# Patient Record
Sex: Female | Born: 1990
Health system: Southern US, Community
[De-identification: ages and names within clinical notes are randomized; demographics above are authoritative.]

---

## 2017-05-02 DIAGNOSIS — G8929 Other chronic pain: Secondary | ICD-10-CM | POA: Diagnosis not present

## 2017-05-02 DIAGNOSIS — M79672 Pain in left foot: Secondary | ICD-10-CM | POA: Diagnosis not present

## 2017-06-09 DIAGNOSIS — R05 Cough: Secondary | ICD-10-CM | POA: Diagnosis not present

## 2017-06-09 DIAGNOSIS — J019 Acute sinusitis, unspecified: Secondary | ICD-10-CM | POA: Diagnosis not present

## 2017-06-25 DIAGNOSIS — Z6837 Body mass index (BMI) 37.0-37.9, adult: Secondary | ICD-10-CM | POA: Diagnosis not present

## 2017-06-25 DIAGNOSIS — J329 Chronic sinusitis, unspecified: Secondary | ICD-10-CM | POA: Diagnosis not present

## 2017-11-15 DIAGNOSIS — H6990 Unspecified Eustachian tube disorder, unspecified ear: Secondary | ICD-10-CM | POA: Diagnosis not present

## 2017-11-20 DIAGNOSIS — Z01419 Encounter for gynecological examination (general) (routine) without abnormal findings: Secondary | ICD-10-CM | POA: Diagnosis not present

## 2018-01-21 ENCOUNTER — Other Ambulatory Visit (HOSPITAL_COMMUNITY): Payer: Self-pay | Admitting: Orthopedic Surgery

## 2018-01-21 DIAGNOSIS — M545 Low back pain: Secondary | ICD-10-CM

## 2018-01-23 ENCOUNTER — Ambulatory Visit: Payer: PRIVATE HEALTH INSURANCE | Attending: Family Medicine | Admitting: Physical Therapy

## 2018-01-23 ENCOUNTER — Encounter: Payer: Self-pay | Admitting: Physical Therapy

## 2018-01-23 DIAGNOSIS — M5442 Lumbago with sciatica, left side: Secondary | ICD-10-CM | POA: Insufficient documentation

## 2018-01-23 DIAGNOSIS — M6281 Muscle weakness (generalized): Secondary | ICD-10-CM | POA: Insufficient documentation

## 2018-01-23 DIAGNOSIS — M546 Pain in thoracic spine: Secondary | ICD-10-CM | POA: Diagnosis present

## 2018-01-23 DIAGNOSIS — M5441 Lumbago with sciatica, right side: Secondary | ICD-10-CM | POA: Diagnosis present

## 2018-01-23 DIAGNOSIS — M62830 Muscle spasm of back: Secondary | ICD-10-CM | POA: Diagnosis present

## 2018-01-23 NOTE — Therapy (Signed)
[facility] [address] [place], [place], [address] Phone: [phone]   Fax:  [phone]  Physical Therapy Evaluation  Patient Details  Name: Karen Velez MRN: [id number] Date of Birth: 1990-08-02 Referring Provider: Dr Estill Bamberg   Encounter Date: 01/23/2018  PT End of Session - 01/23/18 1418    Visit Number  1    Number of Visits  6    Date for PT Re-Evaluation  02/20/18    Authorization Type  [facility], 6 visits approved, she is waiting until after her MRI results next week to return    PT Start Time  1418    PT Stop Time  1502    PT Time Calculation (min)  44 min    Activity Tolerance  Patient tolerated treatment well    Behavior During Therapy  [facility] for tasks assessed/performed       History reviewed. No pertinent past medical history.  History reviewed. No pertinent surgical history.  There were no vitals filed for this visit.   Subjective Assessment - 01/23/18 1421    Subjective  Pt has been referred to specialist and she no longer has the Lt achilles reflex and MD request to continue with eval however take it very slow until MRI is done.  Pt reports that her legs feel heavy and blat arms fatigue quickly.  Her pain started as pain in the center of her spine and has progressed down to the sacrum along with the tightness that started this week. 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     How long can you sit comfortably?  toleratation varies slouching feels better, constantly having to readjust due to pressure    How long can you walk comfortably?  walking is good little to no limitations    Diagnostic tests  xrays - were fine, MRI scheduled for Tuesday 9/5    Currently in Pain?  Yes    Pain Score  7     Pain Location  Back    Pain Orientation  Left;Lower;Mid    Pain Descriptors / Indicators  Tightness;Sore    Pain Type  Acute pain    Pain Onset  1 to 4 weeks ago    Pain Frequency  Constant    Aggravating Factors   sitting, movement    Pain Relieving Factors  nothing         [facility] PT Assessment - 01/23/18 0001      Assessment   Medical Diagnosis  Low back pain     Referring Provider  Dr Estill Bamberg    Onset Date/Surgical Date  12/19/17    Hand Dominance  Right    Next MD Visit  02/03/18    Prior Therapy  none      Precautions   Precautions  Other (comment)   taking it easy until MRI,    Precaution Comments  no lifitng over 15#      Balance Screen   Has the patient fallen in the past 6 months  No      Home Environment   Living Environment  Private residence    Home Layout  Two level   doing ok on stairs     Prior Function   Level of Independence  Independent    Vocation  Full time employment    Vocation Requirements  CNA lifting transfering paients    Leisure  hang out with friends      Observation/Other Assessments   Focus  on Therapeutic Outcomes (FOTO)   53% limited      Functional Tests   Functional tests  Squat;Single leg stance      Squat   Comments  WNL      Single Leg Stance   Comments  bilat  > 10 sec       Posture/Postural Control   Posture/Postural Control  Postural limitations    Postural Limitations  Forward head;Increased lumbar lordosis   extra abdominal girth   Posture Comments  Lt LE longer than Rt       ROM / Strength   AROM / PROM / Strength  AROM;Strength      AROM   AROM Assessment Site  Lumbar    Lumbar Flexion  4" from floor HS pulling    Lumbar Extension  75% present with pulling and pain at waist line    Lumbar - Right Side Bend  90% with low back pain    Lumbar - Left Side Bend  90% with low back pain    Lumbar - Right Rotation  WNL with slight pain    Lumbar - Left Rotation  WNL with slght pain      Strength   Strength Assessment Site  Hip;Knee;Ankle;Lumbar    Right/Left Hip  Left;Right   grossly 4+/5, took a sec for her to catch hole   Right/Left Knee  --   Lt WNL, Rt 4+/5   Right/Left Ankle  --   WNL    Lumbar Flexion  --   TA fair   Lumbar Extension  --   multifidi fair     Flexibility   Soft Tissue Assessment /Muscle Length  yes   hypermobile through her hips   Hamstrings  WNL -       Palpation   Spinal mobility  hypermobile in thoracic T3-7, pain with PA mob in upper sacrum, all others WNL with CPA mobs     Palpation comment  tight and tender in bilat lower thoracic and lumbar paraspinals and Rt gluts.       Special Tests   Other special tests  (-) slump test bilat                Objective measurements completed on examination: See above findings.      [facility] Adult PT Treatment/Exercise - 01/23/18 0001      Exercises   Exercises  Lumbar      Lumbar Exercises: Stretches   Lower Trunk Rotation  --   10 reps     Lumbar Exercises: Supine   Ab Set  10 reps;5 seconds   VC for form     Lumbar Exercises: Quadruped   Madcat/Old Horse  10 reps      Modalities   Modalities  Electrical Stimulation;Moist Heat      Moist Heat Therapy   Number Minutes Moist Heat  15 Minutes    Moist Heat Location  Lumbar Spine      Electrical Stimulation   Electrical Stimulation Location  lumbar    Electrical Stimulation Action  IFC    Electrical Stimulation Parameters  to tolerance    Electrical Stimulation Goals  Tone;Pain                  PT Long Term Goals - 01/23/18 1658      PT LONG TERM GOAL #1   Title  i with advanced HEP for hips and core     Time  6  Period  Weeks    Status  New    Target Date  03/06/18      PT LONG TERM GOAL #2   Title  perform lumbar ROM WNL with no pain to allow her to perform ADLs per her previous level     Time  6    Period  Weeks    Status  New    Target Date  03/06/18      PT LONG TERM GOAL #3   Title  improve bilat hip strength =/> 5-/5 to allow her to return to full duty at work    Time  6    Period  Weeks    Status  New    Target Date  03/06/18      PT LONG TERM GOAL #4   Title  demo safe ways to assist  patients to decrease risk of reinjury    Time  6    Period  Weeks    Status  New    Target Date  03/06/18      PT LONG TERM GOAL #5   Title  improve FOTO =/< 38% limited     Time  6    Period  Weeks    Status  New             Plan - 01/23/18 1436    Clinical Impression Statement  26 yo female presents with mid and low back pain, overall body fatigue and heaviness.  She has painful lumbar ROM, weakness in her hips and core and muscle spasms/tightness/pain in the paraspinals from mid thoracic to sacrum.  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  Pt reports her care has been transfered to Dr Yevette Edwards and he is concerned regarding absent Lt achilles reflex and has ordered a backMRI to assess. He asked for PT to be easy until results.  Pt had prior surgery to the Lt ankle however reports she never had decreased reflexes after that.     History and Personal Factors relevant to plan of care:  Lt ankle reconstruction  - slightly weak as compared to Rt     Clinical Presentation  Evolving    Clinical Decision Making  Low    Rehab Potential  Good    PT Frequency  2x / week    PT Duration  6 weeks    PT Treatment/Interventions  Dry needling;Manual techniques;Moist Heat;Traction;Therapeutic activities;Patient/family education;Taping;Ultrasound;Therapeutic exercise;Cryotherapy;Electrical Stimulation    PT Next Visit Plan  await results of MRI to see how to proceed. Has been approved 6 visits with [facility]    Consulted and Agree with Plan of Care  Patient       Patient will benefit from skilled therapeutic intervention in order to improve the following deficits and impairments:  Pain, Increased muscle spasms, Decreased strength, Decreased range of motion  Visit Diagnosis: Acute bilateral low back pain with bilateral sciatica - Plan: PT plan of care cert/re-cert  Pain in thoracic spine - Plan: PT plan of care cert/re-cert  Muscle weakness (generalized) - Plan: PT plan of care  cert/re-cert  Muscle spasm of back - Plan: PT plan of care cert/re-cert     Problem List There are no active problems to display for this patient.   Roderic Scarce PT  01/23/2018, 5:02 PM  [facility] [address] [place], [place], [address] Phone: [phone]   Fax:  [phone]  Name: Karen Velez MRN: [id number] Date  of Birth: [birth year]

## 2018-01-29 ENCOUNTER — Ambulatory Visit (HOSPITAL_COMMUNITY)
Admission: RE | Admit: 2018-01-29 | Discharge: 2018-01-29 | Disposition: A | Discharge status: Home / Self Care | Payer: PRIVATE HEALTH INSURANCE | Source: Ambulatory Visit | Attending: Orthopedic Surgery | Admitting: Orthopedic Surgery

## 2018-01-29 DIAGNOSIS — M546 Pain in thoracic spine: Secondary | ICD-10-CM | POA: Diagnosis present

## 2018-01-29 DIAGNOSIS — M5126 Other intervertebral disc displacement, lumbar region: Secondary | ICD-10-CM | POA: Diagnosis not present

## 2018-01-29 DIAGNOSIS — M545 Low back pain: Secondary | ICD-10-CM

## 2018-02-06 ENCOUNTER — Encounter: Payer: Self-pay | Admitting: Physical Therapy

## 2018-02-06 ENCOUNTER — Ambulatory Visit: Payer: PRIVATE HEALTH INSURANCE | Attending: Family Medicine | Admitting: Physical Therapy

## 2018-02-06 DIAGNOSIS — M62830 Muscle spasm of back: Secondary | ICD-10-CM | POA: Diagnosis present

## 2018-02-06 DIAGNOSIS — M5442 Lumbago with sciatica, left side: Secondary | ICD-10-CM | POA: Diagnosis present

## 2018-02-06 DIAGNOSIS — M546 Pain in thoracic spine: Secondary | ICD-10-CM | POA: Diagnosis present

## 2018-02-06 DIAGNOSIS — M5441 Lumbago with sciatica, right side: Secondary | ICD-10-CM | POA: Diagnosis present

## 2018-02-06 DIAGNOSIS — M6281 Muscle weakness (generalized): Secondary | ICD-10-CM | POA: Insufficient documentation

## 2018-02-07 ENCOUNTER — Encounter: Payer: Self-pay | Admitting: Physical Therapy

## 2018-02-07 NOTE — Therapy (Signed)
[facility] [address] [place], [place], [address] Phone: [phone]   Fax:  [phone]  Physical Therapy Treatment  Patient Details  Name: Karen EllisRebekah Gugliotta MRN: [id number] Date of Birth: [birth year] Referring Provider: Dr Estill BambergMark Dumonski   Encounter Date: 02/06/2018  PT End of Session - 02/07/18 0803    Visit Number  2    Number of Visits  6    Date for PT Re-Evaluation  02/20/18    Authorization Type  [facility], 6 visits approved, she is waiting until after her MRI results next week to return    PT Start Time  0800    PT Stop Time  0844    PT Time Calculation (min)  44 min    Activity Tolerance  Patient tolerated treatment well    Behavior During Therapy  [facility] assessed/performed       History reviewed. No pertinent past medical history.  History reviewed. No pertinent surgical history.  There were no vitals filed for this visit.  Subjective Assessment - 02/06/18 0826    Subjective  Patients pain was about a 6/10 coming into the clinic today. She reports her gluts have been getting tight over the past few days.     How long can you sit comfortably?  toleratation varies slouching feels better, constantly having to readjust due to pressure    How long can you walk comfortably?  walking is good little to no limitations    Diagnostic tests  xrays - were fine, MRI scheduled for Tuesday 9/5    Currently in Pain?  Yes    Pain Score  6     Pain Orientation  Left;Lower    Pain Descriptors / Indicators  Aching    Pain Type  Acute pain    Pain Onset  1 to 4 weeks ago    Pain Frequency  Constant    Aggravating Factors   sitting, movement     Pain Relieving Factors  nothing                        [facility] Adult PT Treatment/Exercise - 02/07/18 0001      Lumbar Exercises: Stretches   Active Hamstring Stretch Limitations  90/90 2x20 sec hold     Single Knee to Chest Stretch Limitations  attempoted but did  not feel a stretch     Lower Trunk Rotation Limitations  x10     Piriformis Stretch Limitations  3x20 sec hold each leg       Lumbar Exercises: Supine   Ab Set  10 reps;5 seconds   VC for form   Clam Limitations  2x10 yellow mod cuing for abdominal breathing     Bent Knee Raise Limitations  2x10       Lumbar Exercises: Prone   Other Prone Lumbar Exercises  reviewed prone on elbows. Educated patient on the reason behind prone on elbows and positioning 4 min performed       Lumbar Exercises: Quadruped   Madcat/Old Horse  10 reps   reviewed technique      Moist Heat Therapy   Number Minutes Moist Heat  15 Minutes    Moist Heat Location  Lumbar Spine      Electrical Stimulation   Electrical Stimulation Location  lumbar    Electrical Stimulation Action  IFC     Electrical Stimulation Parameters  to tolerance     Electrical Stimulation Goals  Tone;Pain  Manual Therapy   Manual Therapy  Soft tissue mobilization;Joint mobilization    Joint Mobilization  gentle PA mobilizations at L4 -L5     Soft tissue mobilization  IASTYM and trigger point release to upper glutes and lumbar spine        Trigger Point Dry Needling - 02/07/18 0813    Consent Given?  Yes    Education Handout Provided  Yes    Longissimus Response  Twitch response elicited    Gluteus Maximus Response  Twitch response elicited   to glut medius           PT Education - 02/06/18 0900    Education Details  benefits and risks of TPDN, how to manage HEP; disc buldge POC     Person(s) Educated  Patient    Methods  Explanation;Tactile cues;Demonstration;Verbal cues    Comprehension  Verbalized understanding;Returned demonstration;Verbal cues required;Tactile cues required          PT Long Term Goals - 02/07/18 0806      PT LONG TERM GOAL #1   Title  i with advanced HEP for hips and core     Time  6    Period  Weeks    Status  On-going      PT LONG TERM GOAL #2   Title  perform lumbar ROM WNL with no  pain to allow her to perform ADLs per her previous level     Time  6    Period  Weeks    Status  On-going      PT LONG TERM GOAL #3   Title  improve bilat hip strength =/> 5-/5 to allow her to return to full duty at work    Time  6    Period  Weeks    Status  On-going      PT LONG TERM GOAL #4   Title  demo safe ways to assist patients to decrease risk of reinjury    Time  6    Period  Weeks    Status  On-going      PT LONG TERM GOAL #5   Title  improve FOTO =/< 38% limited     Time  6    Period  Weeks    Status  On-going            Plan - 02/06/18 0900    Clinical Impression Statement  Good twitchrepsonse in the left lumbar parapsinals and left gluteal. Improved stiffness reported after TPDN and manual therapy. Still tightness on the right. Consder needling the right next visit. Good tolerance to nedw ther-ex. Decreased stiffness with glut stretch. Patient given updated HEp. She will get TENS soon. Therapy reviewed prone on elbows. She was not haveing much pain at that time. She wasadvised to perfrom prone on elbows when she is painful. Therapy will progress to press -ups if she has benefit.     Clinical Presentation  Evolving    Clinical Decision Making  Moderate    Rehab Potential  Good    PT Frequency  2x / week    PT Duration  6 weeks    PT Treatment/Interventions  Dry needling;Manual techniques;Moist Heat;Traction;Therapeutic activities;Patient/family education;Taping;Ultrasound;Therapeutic exercise;Cryotherapy;Electrical Stimulation    PT Next Visit Plan  await results of MRI to see how to proceed. Has been approved 6 visits with [facility]    Consulted and Agree with Plan of Care  Patient       Patient will benefit from skilled therapeutic  intervention in order to improve the following deficits and impairments:  Pain, Increased muscle spasms, Decreased strength, Decreased range of motion  Visit Diagnosis: Acute bilateral low back pain with bilateral sciatica  Pain in  thoracic spine  Muscle weakness (generalized)  Muscle spasm of back     Problem List There are no active problems to display for this patient.   Dessie Coma PT DPT  02/07/2018, 8:13 AM  [facility] [address] [place], [place], [address] Phone: [phone]   Fax:  [phone]  Name: Aowyn Rozeboom MRN: [id number] Date of Birth: September 24, 1990

## 2018-02-08 ENCOUNTER — Ambulatory Visit: Payer: PRIVATE HEALTH INSURANCE | Admitting: Physical Therapy

## 2018-02-14 ENCOUNTER — Encounter: Payer: Self-pay | Admitting: Physical Therapy

## 2018-02-14 ENCOUNTER — Ambulatory Visit: Payer: PRIVATE HEALTH INSURANCE | Admitting: Physical Therapy

## 2018-02-14 DIAGNOSIS — M5441 Lumbago with sciatica, right side: Secondary | ICD-10-CM

## 2018-02-14 DIAGNOSIS — M546 Pain in thoracic spine: Secondary | ICD-10-CM

## 2018-02-14 DIAGNOSIS — M62830 Muscle spasm of back: Secondary | ICD-10-CM

## 2018-02-14 DIAGNOSIS — M5442 Lumbago with sciatica, left side: Principal | ICD-10-CM

## 2018-02-14 DIAGNOSIS — M6281 Muscle weakness (generalized): Secondary | ICD-10-CM

## 2018-02-15 ENCOUNTER — Encounter

## 2018-02-15 NOTE — Therapy (Signed)
[facility] [address] [place], [place], [address] Phone: [phone]   Fax:  [phone]  Physical Therapy Treatment  Patient Details  Name: Karen EllisRebekah Gammell MRN: [id number] Date of Birth: Jul 08, 1990 Referring Provider: Dr Estill BambergMark Dumonski   Encounter Date: 02/14/2018  PT End of Session - 02/14/18 1552    Visit Number  3    Number of Visits  6    Date for PT Re-Evaluation  02/20/18    Authorization Type  [facility], 6 visits approved, she is waiting until after her MRI results next week to return    PT Start Time  1545    PT Stop Time  1626    PT Time Calculation (min)  41 min    Activity Tolerance  Patient tolerated treatment well    Behavior During Therapy  [facility] for tasks assessed/performed       History reviewed. No pertinent past medical history.  History reviewed. No pertinent surgical history.  There were no vitals filed for this visit.  Subjective Assessment - 02/14/18 1549    Subjective  Patient reprots her pain has been better. She feels like she has most of her pain when she is lifting things around the house. Her exercises have been good except for her prone on elbows. Therapy will review. She feels it a little in her back     How long can you sit comfortably?  toleratation varies slouching feels better, constantly having to readjust due to pressure    How long can you walk comfortably?  walking is good little to no limitations    Diagnostic tests  xrays - were fine, MRI scheduled for Tuesday 9/5    Currently in Pain?  No/denies    Pain Location  Back    Pain Orientation  Left;Lower    Pain Descriptors / Indicators  Aching    Pain Type  Acute pain    Pain Onset  1 to 4 weeks ago    Pain Frequency  Constant    Aggravating Factors   sitting, movment     Pain Relieving Factors  Nothing     Multiple Pain Sites  No                       [facility] Adult PT Treatment/Exercise - 02/15/18 0001      Self-Care   Self-Care  Other Self-Care Comments    Other Self-Care Comments   reviewed use of the TENS unit.  reviewed proper hip hinge andlifting technique.       Exercises   Exercises  Lumbar      Lumbar Exercises: Stretches   Active Hamstring Stretch Limitations  90/90 2x20 sec hold     Single Knee to Chest Stretch Limitations  attempted but did not feel a stretch     Lower Trunk Rotation Limitations  x10     Piriformis Stretch Limitations  3x20 sec hold each leg     Other Lumbar Stretch Exercise  prayer stretch/ lateral prayer stretch       Manual Therapy   Manual therapy comments  LAD to bilateral lower extremity     Joint Mobilization  gentle PA mobilizations at L4 -L5     Soft tissue mobilization  IASTYM and trigger point release to upper glutes and lumbar spine        Trigger Point Dry Needling - 02/15/18 1201    Consent Given?  Yes    Education Handout  Provided  Yes    Longissimus Response  Twitch response elicited    Gluteus Maximus Response  Twitch response elicited           PT Education - 02/14/18 1552    Education Details  reviewed use of the tens unit    Person(s) Educated  Patient    Methods  Explanation;Demonstration;Tactile cues;Verbal cues    Comprehension  Verbalized understanding;Returned demonstration;Verbal cues required;Tactile cues required          PT Long Term Goals - 02/07/18 0806      PT LONG TERM GOAL #1   Title  i with advanced HEP for hips and core     Time  6    Period  Weeks    Status  On-going      PT LONG TERM GOAL #2   Title  perform lumbar ROM WNL with no pain to allow her to perform ADLs per her previous level     Time  6    Period  Weeks    Status  On-going      PT LONG TERM GOAL #3   Title  improve bilat hip strength =/> 5-/5 to allow her to return to full duty at work    Time  6    Period  Weeks    Status  On-going      PT LONG TERM GOAL #4   Title  demo safe ways to assist patients to decrease risk of reinjury     Time  6    Period  Weeks    Status  On-going      PT LONG TERM GOAL #5   Title  improve FOTO =/< 38% limited     Time  6    Period  Weeks    Status  On-going            Plan - 02/15/18 0933    Clinical Impression Statement  Therapy reviewed proper lifting technique with the patient. She had difficulty at first disassociating her hips an knees but was able to with practice. 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     Clinical Presentation  Evolving    Clinical Decision Making  Moderate    Rehab Potential  Good    PT Frequency  2x / week    PT Duration  6 weeks    PT Treatment/Interventions  Dry needling;Manual techniques;Moist Heat;Traction;Therapeutic activities;Patient/family education;Taping;Ultrasound;Therapeutic exercise;Cryotherapy;Electrical Stimulation    PT Next Visit Plan  await results of MRI to see how to proceed. Has been approved 6 visits with [facility]    PT Home Exercise Plan  reviewed prayer stretch and prayer ata sink. Patient given min suqat with hip hinge.     Consulted and Agree with Plan of Care  Patient       Patient will benefit from skilled therapeutic intervention in order to improve the following deficits and impairments:  Pain, Increased muscle spasms, Decreased strength, Decreased range of motion  Visit Diagnosis: Acute bilateral low back pain with bilateral sciatica  Pain in thoracic spine  Muscle weakness (generalized)  Muscle spasm of back     Problem List There are no active problems to display for this patient.   Dessie Coma  PT DPT  02/15/2018, 12:04 PM  [facility] [address] [place], [place], [address] Phone: [phone]   Fax:  [phone]  Name: Caitlan Chauca MRN: [id number] Date  of Birth: 11/05/1990

## 2018-02-18 ENCOUNTER — Ambulatory Visit: Payer: PRIVATE HEALTH INSURANCE | Attending: Family Medicine | Admitting: Physical Therapy

## 2018-02-18 ENCOUNTER — Encounter: Payer: Self-pay | Admitting: Physical Therapy

## 2018-02-18 DIAGNOSIS — M5442 Lumbago with sciatica, left side: Secondary | ICD-10-CM | POA: Insufficient documentation

## 2018-02-18 DIAGNOSIS — M6281 Muscle weakness (generalized): Secondary | ICD-10-CM | POA: Diagnosis present

## 2018-02-18 DIAGNOSIS — M546 Pain in thoracic spine: Secondary | ICD-10-CM | POA: Insufficient documentation

## 2018-02-18 DIAGNOSIS — M5441 Lumbago with sciatica, right side: Secondary | ICD-10-CM | POA: Insufficient documentation

## 2018-02-18 DIAGNOSIS — M62830 Muscle spasm of back: Secondary | ICD-10-CM | POA: Diagnosis present

## 2018-02-18 NOTE — Therapy (Signed)
[facility] [address] [place], [place], [id number] Phone: [phone]   Fax:  [phone]  Physical Therapy Treatment  Patient Details  Name: Karen Velez MRN: [id number] Date of Birth: April 02, 1991 Referring Provider: Dr Estill BambergMark Dumonski   Encounter Date: 02/18/2018  PT End of Session - 02/18/18 0806    Visit Number  4    Number of Visits  6    Date for PT Re-Evaluation  02/20/18    Authorization Type  [facility], 6 visits approved, she is waiting until after her MRI results next week to return    PT Start Time  0800    PT Stop Time  0844    PT Time Calculation (min)  44 min    Activity Tolerance  Patient tolerated treatment well    Behavior During Therapy  [facility] for tasks assessed/performed       History reviewed. No pertinent past medical history.  History reviewed. No pertinent surgical history.  There were no vitals filed for this visit.  Subjective Assessment - 02/18/18 0804    Subjective  Patient reports her back has been feeling pretty good. It is just uncomfortbals. No radicualr pain. She worked both days over the weekend.     How long can you sit comfortably?  toleratation varies slouching feels better, constantly having to readjust due to pressure    How long can you walk comfortably?  walking is good little to no limitations    Diagnostic tests  xrays - were fine, MRI scheduled for Tuesday 9/5    Currently in Pain?  No/denies                       [facility] Treatment/Exercise - 02/18/18 0001      Exercises   Exercises  Lumbar      Lumbar Exercises: Stretches   Active Hamstring Stretch Limitations  90/90 2x20 sec hold     Lower Trunk Rotation Limitations  x10     Piriformis Stretch Limitations  3x20 sec hold each leg       Lumbar Exercises: Standing   Other Standing Lumbar Exercises  scap retraction red 2x10 with cuing for breathing; shoulder extension with cuing for breathing x10 red        Lumbar Exercises: Seated   Other Seated Lumbar Exercises  scap retraction      Lumbar Exercises: Supine   Ab Set  10 reps;5 seconds   VC for form   Clam Limitations  2x10 yellow mod cuing for abdominal breathing     Bent Knee Raise Limitations  2x10     Bridge Limitations  2x10       Manual Therapy   Manual therapy comments  LAD to bilateral lower extremity     Joint Mobilization  gentle PA mobilizations at L4 -L5     Soft tissue mobilization  IASTYM and trigger point release to upper glutes and lumbar spine        Trigger Point Dry Needling - 02/18/18 1108    Consent Given?  Yes    Education Handout Provided  Yes    Longissimus Response  Twitch response elicited           PT Education - 02/18/18 0806    Education Details  reviewed HEP and exercise technqiue     Person(s) Educated  Patient    Methods  Explanation;Demonstration;Tactile cues;Verbal cues    Comprehension  Verbalized understanding;Returned demonstration;Verbal cues required;Tactile  cues required          PT Long Term Goals - 02/18/18 0835      PT LONG TERM GOAL #1   Title  i with advanced HEP for hips and core     Baseline  added in advanced exercises    Time  6    Period  Weeks    Status  On-going      PT LONG TERM GOAL #2   Title  perform lumbar ROM WNL with no pain to allow her to perform ADLs per her previous level     Time  6    Period  Weeks    Status  On-going      PT LONG TERM GOAL #3   Time  6    Period  Weeks    Status  On-going      PT LONG TERM GOAL #4   Title  demo safe ways to assist patients to decrease risk of reinjury    Time  6    Period  Weeks    Status  On-going      PT LONG TERM GOAL #5   Title  improve FOTO =/< 38% limited     Time  6    Period  Weeks    Status  On-going            Plan - 02/18/18 0831    Clinical Impression Statement  The patient is making good progress. she only had 2 small spots to needle. She is having minor soreness on the  right side of L4 and L5. Therapy advanced her exercises. She was given bridges and UE ther-ex to work on. Sheis off the next few days. She was encouraged to workon her squats.     Clinical Presentation  Evolving    Clinical Decision Making  Moderate    Rehab Potential  Good    PT Frequency  2x / week    PT Duration  6 weeks    PT Treatment/Interventions  Dry needling;Manual techniques;Moist Heat;Traction;Therapeutic activities;Patient/family education;Taping;Ultrasound;Therapeutic exercise;Cryotherapy;Electrical Stimulation    PT Next Visit Plan  await results of MRI to see how to proceed. Has been approved 6 visits with [facility]    PT Home Exercise Plan  reviewed prayer stretch and prayer ata sink. Patient given min suqat with hip hinge.     Consulted and Agree with Plan of Care  Patient       Patient will benefit from skilled therapeutic intervention in order to improve the following deficits and impairments:  Pain, Increased muscle spasms, Decreased strength, Decreased range of motion  Visit Diagnosis: Acute bilateral low back pain with bilateral sciatica  Pain in thoracic spine  Muscle weakness (generalized)  Muscle spasm of back     Problem List There are no active problems to display for this patient.   Dessie Coma 02/18/2018, 11:09 AM  [facility] [address] [place], [place], [address] Phone: [phone]   Fax:  [phone]  Name: Karen Velez MRN: [id number] Date of Birth: 1991-04-10

## 2018-02-20 ENCOUNTER — Encounter: Payer: Self-pay | Admitting: Physical Therapy

## 2018-02-20 ENCOUNTER — Ambulatory Visit: Payer: PRIVATE HEALTH INSURANCE | Attending: Orthopedic Surgery | Admitting: Physical Therapy

## 2018-02-20 DIAGNOSIS — M6281 Muscle weakness (generalized): Secondary | ICD-10-CM

## 2018-02-20 DIAGNOSIS — M5441 Lumbago with sciatica, right side: Secondary | ICD-10-CM

## 2018-02-20 DIAGNOSIS — M62830 Muscle spasm of back: Secondary | ICD-10-CM | POA: Insufficient documentation

## 2018-02-20 DIAGNOSIS — M5442 Lumbago with sciatica, left side: Secondary | ICD-10-CM | POA: Insufficient documentation

## 2018-02-20 DIAGNOSIS — M546 Pain in thoracic spine: Secondary | ICD-10-CM | POA: Diagnosis present

## 2018-02-20 NOTE — Therapy (Signed)
[facility] [address] [place], [place], [address] Phone: [phone]   Fax:  [phone]  Physical Therapy Treatment  Patient Details  Name: Karen Velez MRN: [id number] Date of Birth: 1991-03-05 Referring Provider: Dr Estill Bamberg   Encounter Date: 02/20/2018  PT End of Session - 02/20/18 0806    Visit Number  5    Number of Visits  6    Date for PT Re-Evaluation  02/20/18    Authorization Type  [facility], 6 visits approved, she is waiting until after her MRI results next week to return    PT Start Time  0800    PT Stop Time  0851    PT Time Calculation (min)  51 min    Activity Tolerance  Patient tolerated treatment well    Behavior During Therapy  [facility] for tasks assessed/performed       History reviewed. No pertinent past medical history.  History reviewed. No pertinent surgical history.  There were no vitals filed for this visit.  Subjective Assessment - 02/20/18 0804    Subjective  Patient was washing her face this morning andshe felt a little pull in her back. It is not major just a little pain.     How long can you sit comfortably?  toleratation varies slouching feels better, constantly having to readjust due to pressure    How long can you walk comfortably?  walking is good little to no limitations    Diagnostic tests  xrays - were fine, MRI scheduled for Tuesday 9/5    Currently in Pain?  No/denies    Pain Orientation  Left;Lower    Pain Descriptors / Indicators  Aching    Pain Type  Acute pain    Pain Onset  1 to 4 weeks ago    Pain Frequency  Constant    Aggravating Factors   sitting, movement     Pain Relieving Factors  nothing                        [facility] Adult PT Treatment/Exercise - 02/20/18 0001      Exercises   Exercises  Lumbar      Lumbar Exercises: Stretches   Active Hamstring Stretch Limitations  90/90 2x20 sec hold     Lower Trunk Rotation Limitations  x10     Piriformis Stretch Limitations  3x20 sec hold each leg       Lumbar Exercises: Standing   Other Standing Lumbar Exercises  shoulder extnesion red 2x10 with ab breathing       Lumbar Exercises: Supine   Ab Set  10 reps;5 seconds   VC for form   Clam Limitations  2x10 Blue     Bent Knee Raise Limitations  2x10    Bridge Limitations  2x10 with blue band iso       Lumbar Exercises: Quadruped   Other Quadruped Lumbar Exercises  alt UE and LE; alt UE with min cuing for core positioning       Manual Therapy   Manual therapy comments  LAD to bilateral lower extremity     Joint Mobilization  gentle PA mobilizations at L4 -L5     Soft tissue mobilization  IASTYM and trigger point release to upper glutes and lumbar spine              PT Education - 02/20/18 0806    Education Details  reviewed HEP and ther-ex  Person(s) Educated  Patient    Methods  Explanation;Demonstration;Tactile cues;Verbal cues    Comprehension  Verbalized understanding;Returned demonstration;Verbal cues required;Tactile cues required          PT Long Term Goals - 02/18/18 0835      PT LONG TERM GOAL #1   Title  i with advanced HEP for hips and core     Baseline  added in advanced exercises    Time  6    Period  Weeks    Status  On-going      PT LONG TERM GOAL #2   Title  perform lumbar ROM WNL with no pain to allow her to perform ADLs per her previous level     Time  6    Period  Weeks    Status  On-going      PT LONG TERM GOAL #3   Time  6    Period  Weeks    Status  On-going      PT LONG TERM GOAL #4   Title  demo safe ways to assist patients to decrease risk of reinjury    Time  6    Period  Weeks    Status  On-going      PT LONG TERM GOAL #5   Title  improve FOTO =/< 38% limited     Time  6    Period  Weeks    Status  On-going            Plan - 02/20/18 0808    Clinical Impression Statement  The patient continues to mak good progrress. She had alittle pain when she  started but she was able to complete her ther-ex and therapy was abel to advance her ther-ex. She still has an area of spasming on the right side.     Clinical Presentation  Evolving    Clinical Decision Making  Moderate    Rehab Potential  Good    PT Frequency  2x / week    PT Duration  6 weeks    PT Treatment/Interventions  Dry needling;Manual techniques;Moist Heat;Traction;Therapeutic activities;Patient/family education;Taping;Ultrasound;Therapeutic exercise;Cryotherapy;Electrical Stimulation    PT Next Visit Plan  await results of MRI to see how to proceed. Has been approved 6 visits with WC    PT Home Exercise Plan  reviewed prayer stretch and prayer ata sink. Patient given min suqat with hip hinge.     Consulted and Agree with Plan of Care  Patient       Patient will benefit from skilled therapeutic intervention in order to improve the following deficits and impairments:  Pain, Increased muscle spasms, Decreased strength, Decreased range of motion  Visit Diagnosis: Acute bilateral low back pain with bilateral sciatica  Pain in thoracic spine  Muscle weakness (generalized)  Muscle spasm of back     Problem List There are no active problems to display for this patient.   Dessie Coma 02/20/2018, 3:13 PM  [facility] [address] [place], [place], [address] Phone: [phone]   Fax:  [phone]  Name: Kiala Faraj MRN: [id number] Date of Birth: January 18, 1991

## 2018-02-26 ENCOUNTER — Ambulatory Visit: Payer: PRIVATE HEALTH INSURANCE | Attending: Orthopedic Surgery | Admitting: Physical Therapy

## 2018-02-26 DIAGNOSIS — M546 Pain in thoracic spine: Secondary | ICD-10-CM | POA: Diagnosis not present

## 2018-02-26 DIAGNOSIS — M6281 Muscle weakness (generalized): Secondary | ICD-10-CM | POA: Insufficient documentation

## 2018-02-26 NOTE — Therapy (Signed)
[place], [place], [address] Phone: [phone]   Fax:  [phone]  Physical Therapy Treatment/ Discharge   Patient Details  Name: Karen Velez MRN: [id number] Date of Birth: 1990/07/10 Referring Provider (PT): Dr Phylliss Bob   Encounter Date: 02/26/2018  PT End of Session - 02/26/18 1407    Visit Number  6    Number of Visits  6    Authorization Type  [facility], 6 visits approved, she is waiting until after her MRI results next week to return    PT Start Time  0130    PT Stop Time  0208    PT Time Calculation (min)  38 min    Activity Tolerance  Patient tolerated treatment well    Behavior During Therapy  [facility] for tasks assessed/performed       No past medical history on file.  No past surgical history on file.  There were no vitals filed for this visit.  Subjective Assessment - 02/26/18 1333    Subjective  Pt reports no back pain at time of visit. Has started back at work without any pain. Perfoming HEP without any issues or soreness.     Currently in Pain?  No/denies                               PT Education - 02/26/18 1646    Education Details  Reviewed HEP and additional exercises; educated on safe squat mechanics and transfers   Person(s) Educated  Patient    Methods  Explanation;Demonstration;Verbal cues;Handout;Tactile cues    Comprehension  Verbalized understanding;Returned demonstration;Verbal cues required;Tactile cues required          PT Long Term Goals - 02/26/18 1657      PT LONG TERM GOAL #1   Title  i with advanced HEP for hips and core     Baseline  added in advanced exercises    Time  6    Period  Weeks    Status  Achieved      PT LONG TERM GOAL #2   Title  perform lumbar ROM WNL with no pain to allow her to perform ADLs per her previous level     Baseline  full ROM without pain     Time  6    Period  Weeks    Status  Achieved      PT LONG TERM GOAL #3   Title  improve bilat hip strength =/> 5-/5 to allow her to return to full duty at work    Baseline  5/5 gross strength     Time  6    Period  Weeks    Status  Achieved      PT LONG TERM GOAL #4   Title  demo safe ways to assist patients to decrease risk of reinjury    Baseline  reviewed with patient. No cuing required     Time  6    Period  Weeks    Status  Partially Met      PT LONG TERM GOAL #5   Title  improve FOTO =/< 38% limited     Baseline  29% limitation     Time  6    Period  Weeks    Status  Achieved            Plan - 02/26/18 1649    Clinical Impression Statement  Patient continues to make great progress with HEP. She is performing exercises with less cueing and denies pain throughout session. Re-assesed hip strength with pt demonstrating gross 5/5 on Bil LE. She continues to have  mild tightness in right paraspinals along lower thoracic- upper lumbar region, but does not limit ROM. Pt instructed to perform stretches throughout work shift to manage symptoms.        Clinical Presentation  Evolving    Clinical Decision Making  Moderate    Rehab Potential  Good    PT Frequency  2x / week    PT Duration  6 weeks    PT Treatment/Interventions  Dry needling;Manual techniques;Moist Heat;Traction;Therapeutic activities;Patient/family education;Taping;Ultrasound;Therapeutic exercise;Cryotherapy;Electrical Stimulation    PT Next Visit Plan  await results of MRI to see how to proceed    PT Home Exercise Plan  Reviewed squat with lift. Pt given SL bridge and reviewed Cam/Camel stretch.     Consulted and Agree with Plan of Care  Patient       Patient will benefit from skilled therapeutic intervention in order to improve the following deficits and impairments:  Pain, Increased muscle spasms, Decreased strength, Decreased range of motion  Visit Diagnosis: Pain in thoracic spine  Muscle weakness (generalized)     Problem List There are no active  problems to display for this patient.   Carney Living PT DPT  02/27/2018, 12:58 PM   Einar Crow DPT 02/27/2018    [facility] [address] [place], [place], [address] Phone: [phone]   Fax:  [phone]  Name: Briyah Wheelwright MRN: [id number] Date of Birth: 29-Apr-1991

## 2018-07-05 DIAGNOSIS — M5430 Sciatica, unspecified side: Secondary | ICD-10-CM | POA: Diagnosis not present

## 2019-01-09 DIAGNOSIS — Z20828 Contact with and (suspected) exposure to other viral communicable diseases: Secondary | ICD-10-CM | POA: Diagnosis not present

## 2019-01-09 DIAGNOSIS — J029 Acute pharyngitis, unspecified: Secondary | ICD-10-CM | POA: Diagnosis not present

## 2019-01-09 DIAGNOSIS — R51 Headache: Secondary | ICD-10-CM | POA: Diagnosis not present

## 2019-02-04 IMAGING — MR MR THORACIC SPINE W/O CM
4 of 11 series · 18 of 48 positions shown · non-contrast
Comparison: None.

CLINICAL DATA: Initial evaluation for continual back pain with
decreased right Achilles reflex for 1 month.

EXAM:
MRI THORACIC AND LUMBAR SPINE WITHOUT CONTRAST
TECHNIQUE: Multiplanar and multiecho pulse sequences of the thoracic and lumbar
spine were obtained without intravenous contrast.

[Series 6: T2 · sagittal · 3.0mm · 0.64mm/px · 3 of 13 slices shown (1 of 4)]
[im 1/13]
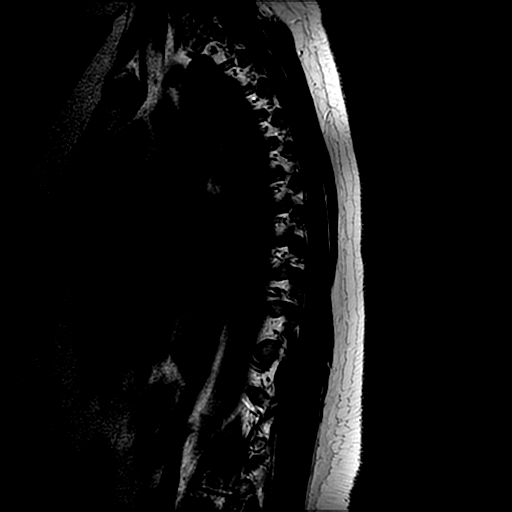
[im 7/13]
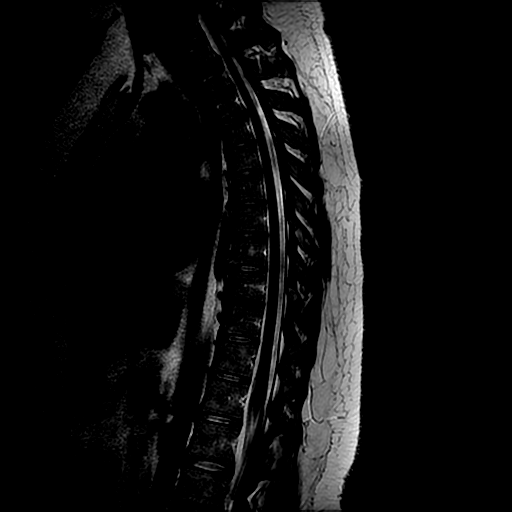
[im 13/13]
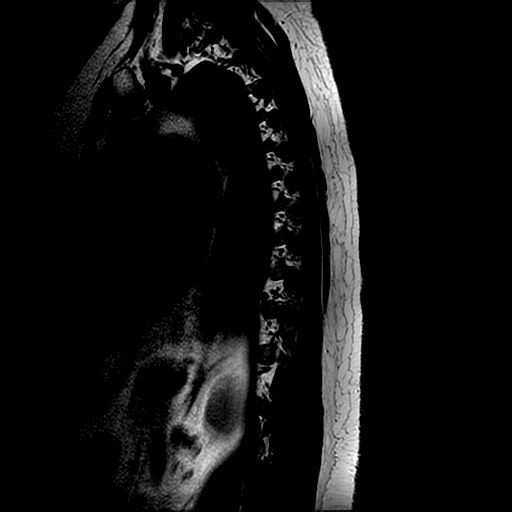

[Series 8: T2 · axial · 4.0mm · 0.39mm/px · z∈[-202,+6]mm · 8 of 41 slices shown (2 of 4)]
[im 1/41]
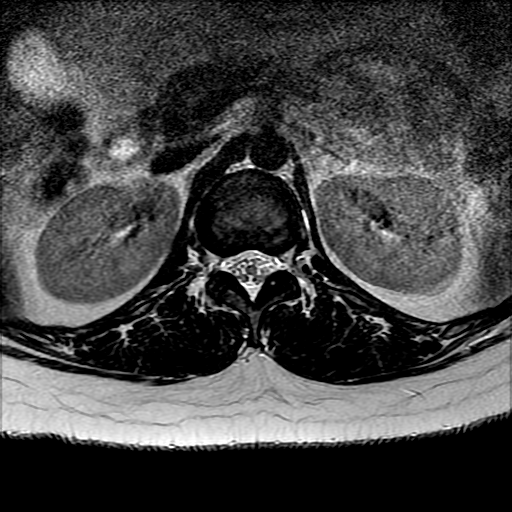
[im 6/41]
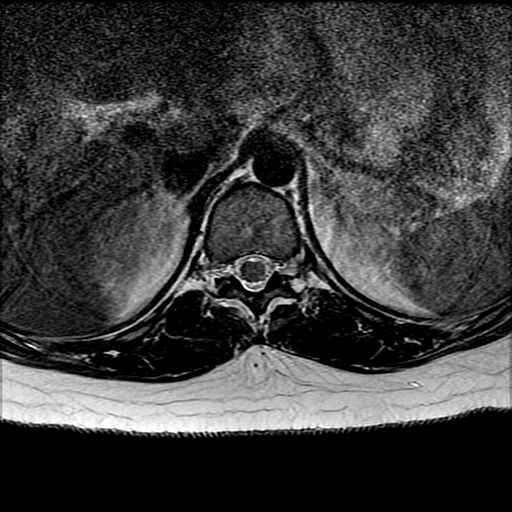
[im 12/41]
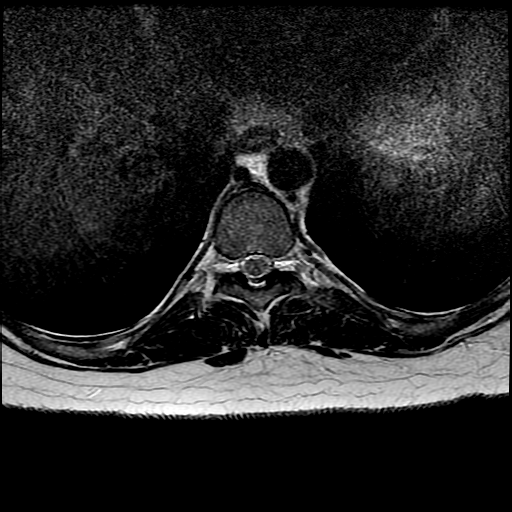
[im 18/41]
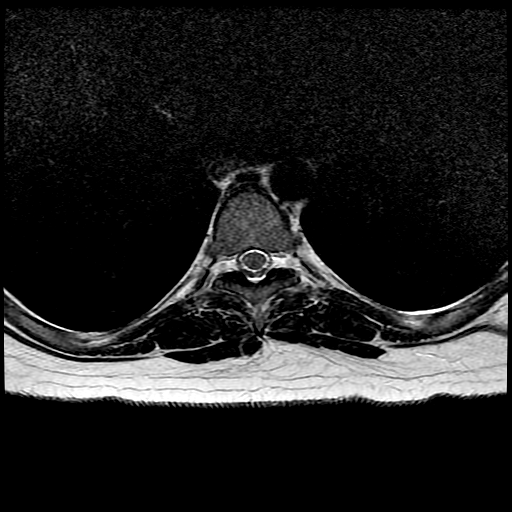
[im 23/41]
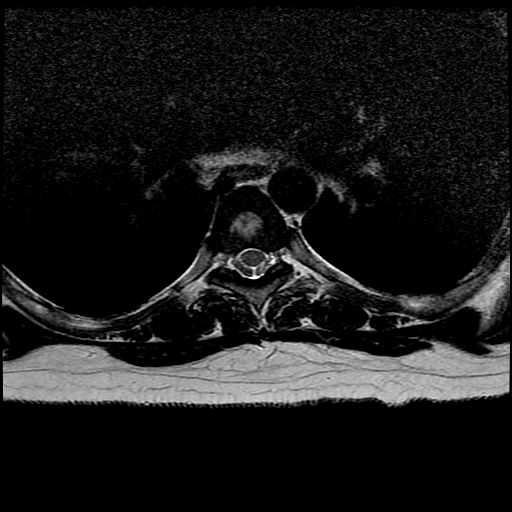
[im 29/41]
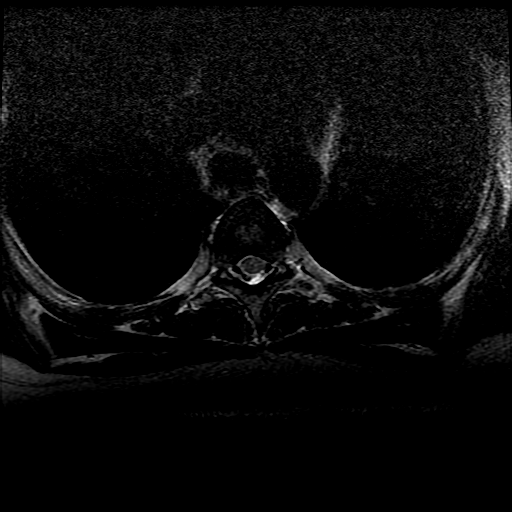
[im 35/41]
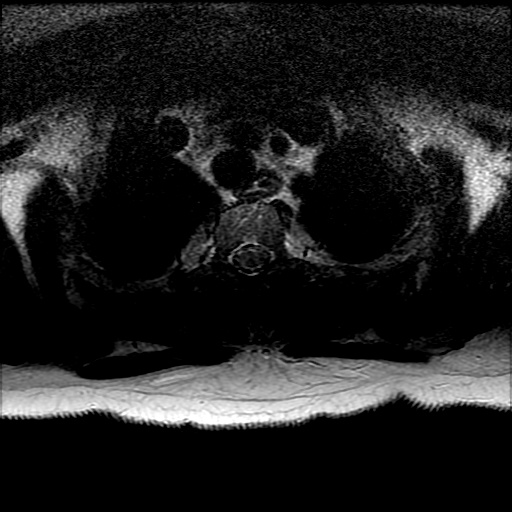
[im 41/41]
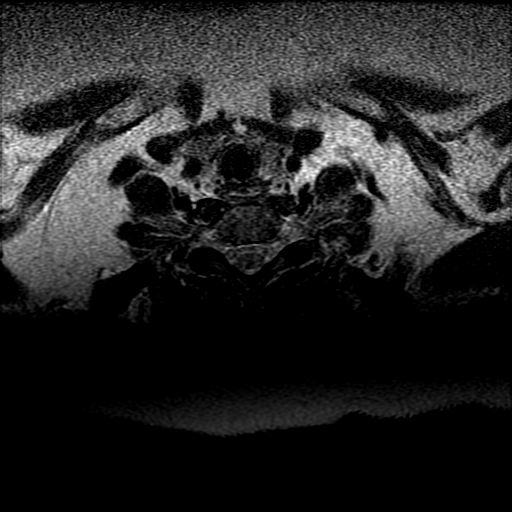

[Series 14: T2 · sagittal · 4.0mm · 0.51mm/px · 2 of 12 slices shown (3 of 4)]
[im 1/12]
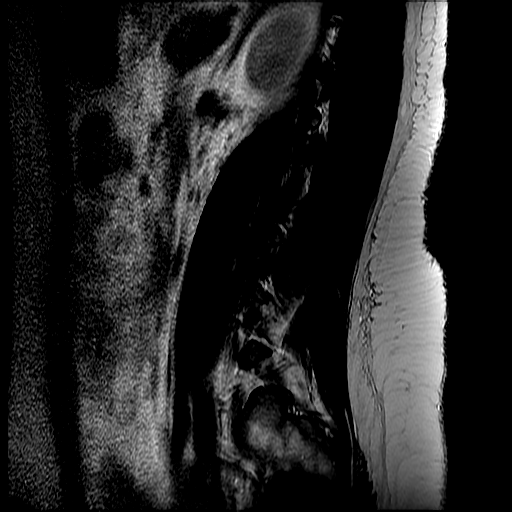
[im 12/12]
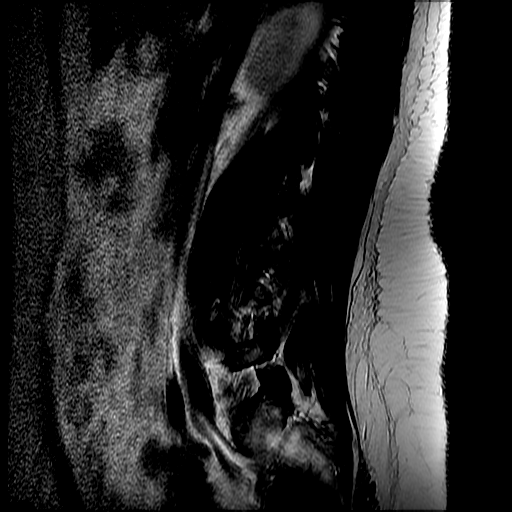

[Series 16: T2 · axial · 4.0mm · 0.39mm/px · z∈[+21,+176]mm · 5 of 33 slices shown (4 of 4)]
[im 1/33]
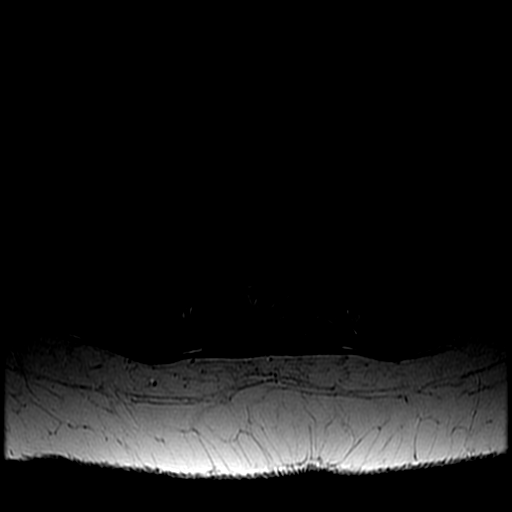
[im 6/33]
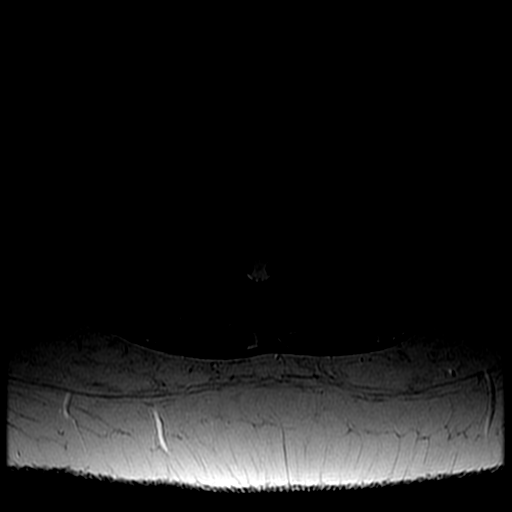
[im 11/33]
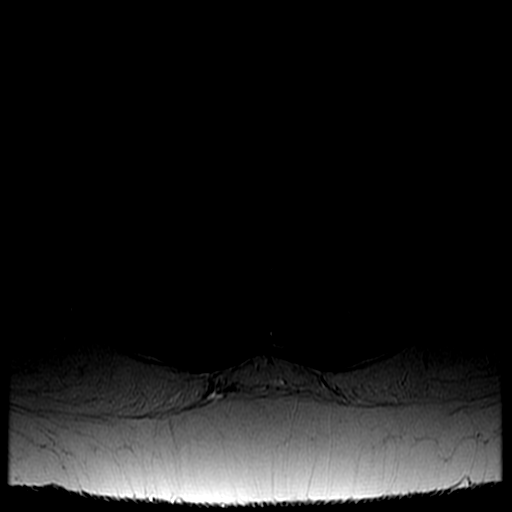
[im 17/33]
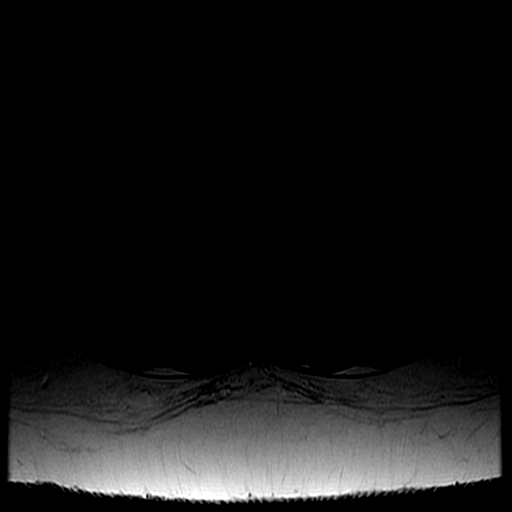
[im 27/33]
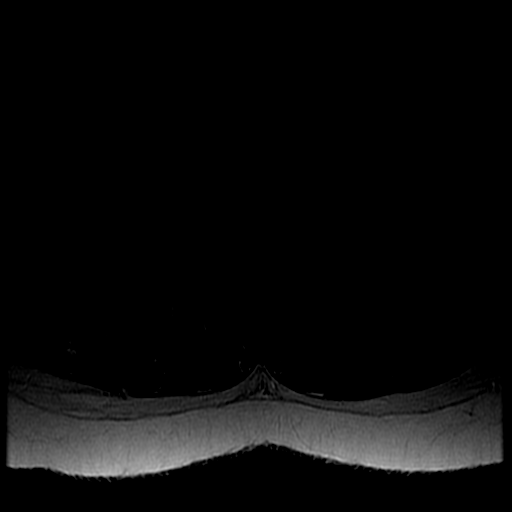

[18 of 48 positions shown; findings below may reference images not displayed]

FINDINGS: MRI THORACIC SPINE FINDINGS

Alignment: Vertebral bodies normally aligned with preservation of
the normal thoracic kyphosis. No listhesis.

Vertebrae: Vertebral body heights well maintained without evidence
for acute or chronic fracture. Bone marrow signal intensity within
normal limits. No discrete or worrisome osseous lesions. No abnormal
marrow edema.

Cord: Signal intensity within the thoracic spinal cord is normal.
Normal cord caliber and morphology.

Paraspinal and other soft tissues: Paraspinous soft tissues within
normal limits. Partially visualized lungs are clear. Visualized
visceral structures unremarkable.

Disc levels:

No significant disc pathology seen within the thoracic spine.
Intervertebral discs are well hydrated with preserved disc height.
No significant disc bulge or disc protrusion. No significant facet
degeneration. No canal or neural foraminal stenosis.

MRI LUMBAR SPINE FINDINGS

Segmentation: Normal segmentation. Lowest well-formed disc labeled
the L5-S1 level.

Alignment: Vertebral bodies normally aligned with preservation of
the normal lumbar lordosis. No listhesis.

Vertebrae: Vertebral body heights well maintained without evidence
for acute or chronic fracture. Bone marrow signal intensity within
normal limits. No discrete or worrisome osseous lesions. No abnormal
marrow edema.

Conus medullaris and cauda equina: Conus extends to the T12-L1
level. Conus and cauda equina appear normal.

Paraspinal and other soft tissues: Paraspinous soft tissues within
normal limits. Visualized visceral structures unremarkable.

Disc levels:

L1-2:  Unremarkable.

L2-3:  Unremarkable.

L3-4: Mild circumferential disc bulge with disc desiccation. No
significant canal or lateral recess stenosis. Foramina remain
patent.

L4-5: Disc desiccation. Superimposed small central disc protrusion
with slight inferior migration. Protruding disc encroaches upon the
lateral recesses bilaterally, closely approximating the descending
L5 nerve roots. Resultant mild bilateral lateral recess stenosis.
Central canal remains patent. Neural foramina remain patent.

L5-S1:  Unremarkable.
IMPRESSION: MRI THORACIC SPINE IMPRESSION

Normal MRI of the thoracic spine. No significant disc pathology or
stenosis.

MRI LUMBAR SPINE IMPRESSION

1. Central disc protrusion at L4-5 with resultant mild bilateral
lateral recess narrowing. Protruding disc closely approximates and
could potentially affect either of the descending L5 nerve roots.
2. Mild disc bulging at L3-4 without significant stenosis or neural
impingement.

## 2019-02-17 DIAGNOSIS — R300 Dysuria: Secondary | ICD-10-CM | POA: Diagnosis not present

## 2019-02-17 DIAGNOSIS — Z01419 Encounter for gynecological examination (general) (routine) without abnormal findings: Secondary | ICD-10-CM | POA: Diagnosis not present

## 2019-02-17 DIAGNOSIS — Z113 Encounter for screening for infections with a predominantly sexual mode of transmission: Secondary | ICD-10-CM | POA: Diagnosis not present

## 2019-04-23 DIAGNOSIS — R05 Cough: Secondary | ICD-10-CM | POA: Diagnosis not present

## 2019-04-23 DIAGNOSIS — J349 Unspecified disorder of nose and nasal sinuses: Secondary | ICD-10-CM | POA: Diagnosis not present

## 2019-04-23 DIAGNOSIS — Z20828 Contact with and (suspected) exposure to other viral communicable diseases: Secondary | ICD-10-CM | POA: Diagnosis not present

## 2019-07-14 DIAGNOSIS — G8929 Other chronic pain: Secondary | ICD-10-CM | POA: Diagnosis not present

## 2019-07-14 DIAGNOSIS — M5442 Lumbago with sciatica, left side: Secondary | ICD-10-CM | POA: Diagnosis not present

## 2019-07-14 DIAGNOSIS — M5441 Lumbago with sciatica, right side: Secondary | ICD-10-CM | POA: Diagnosis not present

## 2019-07-14 DIAGNOSIS — Z0000 Encounter for general adult medical examination without abnormal findings: Secondary | ICD-10-CM | POA: Diagnosis not present

## 2019-07-14 DIAGNOSIS — R3989 Other symptoms and signs involving the genitourinary system: Secondary | ICD-10-CM | POA: Diagnosis not present

## 2019-07-14 DIAGNOSIS — F4322 Adjustment disorder with anxiety: Secondary | ICD-10-CM | POA: Diagnosis not present

## 2019-07-14 DIAGNOSIS — R5383 Other fatigue: Secondary | ICD-10-CM | POA: Diagnosis not present

## 2019-07-14 DIAGNOSIS — K219 Gastro-esophageal reflux disease without esophagitis: Secondary | ICD-10-CM | POA: Diagnosis not present

## 2019-08-11 ENCOUNTER — Other Ambulatory Visit (HOSPITAL_COMMUNITY): Payer: Self-pay | Admitting: General Practice

## 2019-08-11 DIAGNOSIS — F4322 Adjustment disorder with anxiety: Secondary | ICD-10-CM | POA: Diagnosis not present

## 2019-08-11 MED FILL — ESOMEPRAZOLE MAG DR 40 MG C: 40 | 30 days supply | Qty: 30 | Fill #0

## 2019-08-11 MED FILL — ESCITALOPRAM 10 MG TABLET: 10 | 90 days supply | Qty: 90 | Fill #0

## 2019-08-11 MED FILL — BLISOVI FE 1/20 1-20 MG-MCG: 1-20 | 84 days supply | Qty: 84 | Fill #0

## 2019-08-11 MED FILL — FAMOTIDINE 40 MG TABLET: 40 | 30 days supply | Qty: 30 | Fill #0

## 2019-08-12 MED FILL — clonazePAM 0.5 MG TABS: 0.5 | 30 days supply | Qty: 60 | Fill #0

## 2019-09-09 MED FILL — ESOMEPRAZOLE MAG DR 40 MG C: 40 | 30 days supply | Qty: 30 | Fill #0

## 2019-11-24 DIAGNOSIS — R300 Dysuria: Secondary | ICD-10-CM | POA: Diagnosis not present

## 2019-11-24 DIAGNOSIS — Z113 Encounter for screening for infections with a predominantly sexual mode of transmission: Secondary | ICD-10-CM | POA: Diagnosis not present

## 2019-12-05 MED FILL — ESOMEPRAZOLE MAG DR 40 MG C: 40 | 30 days supply | Qty: 30 | Fill #1

## 2020-01-07 MED FILL — ESOMEPRAZOLE MAG DR 40 MG C: 40 | 30 days supply | Qty: 30 | Fill #0

## 2020-01-22 DIAGNOSIS — Z20828 Contact with and (suspected) exposure to other viral communicable diseases: Secondary | ICD-10-CM | POA: Diagnosis not present

## 2020-01-22 DIAGNOSIS — J069 Acute upper respiratory infection, unspecified: Secondary | ICD-10-CM | POA: Diagnosis not present

## 2020-02-06 MED FILL — BLISOVI FE 1/20 1-20 MG-MCG: 1-20 | 84 days supply | Qty: 84 | Fill #1

## 2020-02-06 MED FILL — ESOMEPRAZOLE MAG DR 40 MG C: 40 | 30 days supply | Qty: 30 | Fill #1

## 2020-02-06 MED FILL — ESCITALOPRAM 10 MG TABLET: 10 | 90 days supply | Qty: 90 | Fill #1

## 2020-02-18 ENCOUNTER — Other Ambulatory Visit (HOSPITAL_COMMUNITY): Payer: Self-pay | Admitting: Obstetrics and Gynecology

## 2020-02-18 DIAGNOSIS — N930 Postcoital and contact bleeding: Secondary | ICD-10-CM | POA: Diagnosis not present

## 2020-02-18 DIAGNOSIS — Z01419 Encounter for gynecological examination (general) (routine) without abnormal findings: Secondary | ICD-10-CM | POA: Diagnosis not present

## 2020-02-24 DIAGNOSIS — N930 Postcoital and contact bleeding: Secondary | ICD-10-CM | POA: Diagnosis not present

## 2020-03-08 MED FILL — ESOMEPRAZOLE MAG DR 40 MG C: 40 | 30 days supply | Qty: 30 | Fill #2

## 2020-04-07 ENCOUNTER — Other Ambulatory Visit (HOSPITAL_COMMUNITY): Payer: Self-pay | Admitting: General Practice

## 2020-04-07 MED FILL — ESOMEPRAZOLE MAG DR 40 MG C: 40 | 30 days supply | Qty: 30 | Fill #0

## 2020-05-02 DIAGNOSIS — R21 Rash and other nonspecific skin eruption: Secondary | ICD-10-CM | POA: Diagnosis not present

## 2020-05-02 DIAGNOSIS — B355 Tinea imbricata: Secondary | ICD-10-CM | POA: Diagnosis not present

## 2020-05-06 MED FILL — BLISOVI FE 1/20 1-20 MG-MCG: 1-20 | 84 days supply | Qty: 84 | Fill #0

## 2020-05-06 MED FILL — ESCITALOPRAM 10 MG TABLET: 10 | 90 days supply | Qty: 90 | Fill #2

## 2020-05-06 MED FILL — ESOMEPRAZOLE MAG DR 40 MG C: 40 | 30 days supply | Qty: 30 | Fill #1

## 2020-06-09 MED FILL — ESOMEPRAZOLE MAG DR 40 MG C: 40 | 30 days supply | Qty: 30 | Fill #2

## 2020-07-08 ENCOUNTER — Other Ambulatory Visit (HOSPITAL_COMMUNITY): Payer: Self-pay | Admitting: General Practice

## 2020-07-08 MED FILL — ESOMEPRAZOLE MAG DR 40 MG C: 40 | 30 days supply | Qty: 30 | Fill #0

## 2020-07-27 DIAGNOSIS — Z20828 Contact with and (suspected) exposure to other viral communicable diseases: Secondary | ICD-10-CM | POA: Diagnosis not present

## 2020-07-27 DIAGNOSIS — J209 Acute bronchitis, unspecified: Secondary | ICD-10-CM | POA: Diagnosis not present

## 2020-07-27 DIAGNOSIS — J019 Acute sinusitis, unspecified: Secondary | ICD-10-CM | POA: Diagnosis not present

## 2020-09-06 ENCOUNTER — Other Ambulatory Visit (HOSPITAL_COMMUNITY): Payer: Self-pay

## 2020-09-06 MED FILL — Esomeprazole Magnesium Cap Delayed Release 40 MG (Base Eq): ORAL | 30 days supply | Qty: 30 | Fill #0 | Status: CN

## 2020-09-07 ENCOUNTER — Other Ambulatory Visit (HOSPITAL_COMMUNITY): Payer: Self-pay

## 2020-09-07 MED ORDER — ESOMEPRAZOLE MAGNESIUM 40 MG PO CPDR
40.0000 mg | DELAYED_RELEASE_CAPSULE | Freq: Every day | ORAL | 2 refills | Status: AC
  Filled 2020-09-07 – 2020-09-08 (×2): qty 90, 90d supply, fill #0

## 2020-09-08 ENCOUNTER — Other Ambulatory Visit (HOSPITAL_COMMUNITY): Payer: Self-pay

## 2020-09-16 DIAGNOSIS — J09X2 Influenza due to identified novel influenza A virus with other respiratory manifestations: Secondary | ICD-10-CM | POA: Diagnosis not present

## 2020-09-16 DIAGNOSIS — R0981 Nasal congestion: Secondary | ICD-10-CM | POA: Diagnosis not present

## 2020-09-16 DIAGNOSIS — J111 Influenza due to unidentified influenza virus with other respiratory manifestations: Secondary | ICD-10-CM | POA: Diagnosis not present

## 2020-09-16 DIAGNOSIS — R051 Acute cough: Secondary | ICD-10-CM | POA: Diagnosis not present

## 2020-10-12 DIAGNOSIS — Z1159 Encounter for screening for other viral diseases: Secondary | ICD-10-CM | POA: Diagnosis not present

## 2020-10-15 ENCOUNTER — Other Ambulatory Visit (HOSPITAL_COMMUNITY): Payer: Self-pay

## 2020-10-26 DIAGNOSIS — Z1159 Encounter for screening for other viral diseases: Secondary | ICD-10-CM | POA: Diagnosis not present
